# Patient Record
Sex: Male | Born: 1950 | Race: White | Marital: Married | Smoking: Current every day smoker
Health system: Southern US, Community
[De-identification: ages and names within clinical notes are randomized; demographics above are authoritative.]

## PROBLEM LIST (undated history)

## (undated) DIAGNOSIS — N400 Enlarged prostate without lower urinary tract symptoms: Secondary | ICD-10-CM

---

## 2011-03-15 ENCOUNTER — Other Ambulatory Visit: Payer: Self-pay | Admitting: Specialist

## 2011-03-15 ENCOUNTER — Ambulatory Visit
Admission: RE | Admit: 2011-03-15 | Discharge: 2011-03-15 | Disposition: A | Discharge status: Home / Self Care | Payer: Self-pay | Source: Ambulatory Visit | Attending: Specialist | Admitting: Specialist

## 2011-03-15 DIAGNOSIS — R7611 Nonspecific reaction to tuberculin skin test without active tuberculosis: Secondary | ICD-10-CM

## 2016-01-13 ENCOUNTER — Encounter (HOSPITAL_BASED_OUTPATIENT_CLINIC_OR_DEPARTMENT_OTHER): Payer: Self-pay | Admitting: Emergency Medicine

## 2016-01-13 ENCOUNTER — Emergency Department (HOSPITAL_BASED_OUTPATIENT_CLINIC_OR_DEPARTMENT_OTHER)
Admission: EM | Admit: 2016-01-13 | Discharge: 2016-01-13 | Disposition: A | Discharge status: Home / Self Care | Payer: Medicare Other | Attending: Emergency Medicine | Admitting: Emergency Medicine

## 2016-01-13 ENCOUNTER — Emergency Department (HOSPITAL_BASED_OUTPATIENT_CLINIC_OR_DEPARTMENT_OTHER): Payer: Medicare Other

## 2016-01-13 DIAGNOSIS — Z79899 Other long term (current) drug therapy: Secondary | ICD-10-CM | POA: Insufficient documentation

## 2016-01-13 DIAGNOSIS — F17200 Nicotine dependence, unspecified, uncomplicated: Secondary | ICD-10-CM | POA: Diagnosis not present

## 2016-01-13 DIAGNOSIS — R1032 Left lower quadrant pain: Secondary | ICD-10-CM | POA: Diagnosis present

## 2016-01-13 DIAGNOSIS — K5752 Diverticulitis of both small and large intestine without perforation or abscess without bleeding: Secondary | ICD-10-CM | POA: Diagnosis not present

## 2016-01-13 HISTORY — DX: Benign prostatic hyperplasia without lower urinary tract symptoms: N40.0

## 2016-01-13 LAB — CBC WITH DIFFERENTIAL/PLATELET
Basophils Absolute: 0 10*3/uL (ref 0.0–0.1)
Basophils Relative: 1 %
EOS PCT: 2 %
Eosinophils Absolute: 0.1 10*3/uL (ref 0.0–0.7)
HEMATOCRIT: 42.6 % (ref 39.0–52.0)
Hemoglobin: 13.9 g/dL (ref 13.0–17.0)
LYMPHS PCT: 27 %
Lymphs Abs: 1.7 10*3/uL (ref 0.7–4.0)
MCH: 28.6 pg (ref 26.0–34.0)
MCHC: 32.6 g/dL (ref 30.0–36.0)
MCV: 87.7 fL (ref 78.0–100.0)
Monocytes Absolute: 0.5 10*3/uL (ref 0.1–1.0)
Monocytes Relative: 7 %
NEUTROS ABS: 4 10*3/uL (ref 1.7–7.7)
Neutrophils Relative %: 63 %
PLATELETS: 142 10*3/uL — AB (ref 150–400)
RBC: 4.86 MIL/uL (ref 4.22–5.81)
RDW: 13.7 % (ref 11.5–15.5)
WBC: 6.3 10*3/uL (ref 4.0–10.5)

## 2016-01-13 LAB — COMPREHENSIVE METABOLIC PANEL
ALBUMIN: 3.7 g/dL (ref 3.5–5.0)
ALT: 33 U/L (ref 17–63)
ANION GAP: 6 (ref 5–15)
AST: 29 U/L (ref 15–41)
Alkaline Phosphatase: 80 U/L (ref 38–126)
BUN: 12 mg/dL (ref 6–20)
CHLORIDE: 105 mmol/L (ref 101–111)
CO2: 26 mmol/L (ref 22–32)
Calcium: 9.2 mg/dL (ref 8.9–10.3)
Creatinine, Ser: 0.81 mg/dL (ref 0.61–1.24)
GFR calc Af Amer: >60 mL/min (ref >60)
GFR calc non Af Amer: >60 mL/min (ref >60)
GLUCOSE: 106 mg/dL — AB (ref 65–99)
POTASSIUM: 4.3 mmol/L (ref 3.5–5.1)
Sodium: 137 mmol/L (ref 135–145)
Total Bilirubin: 0.8 mg/dL (ref 0.3–1.2)
Total Protein: 6.4 g/dL — ABNORMAL LOW (ref 6.5–8.1)

## 2016-01-13 LAB — URINALYSIS, ROUTINE W REFLEX MICROSCOPIC
Bilirubin Urine: NEGATIVE
GLUCOSE, UA: NEGATIVE mg/dL
HGB URINE DIPSTICK: NEGATIVE
Ketones, ur: NEGATIVE mg/dL
LEUKOCYTES UA: NEGATIVE
Nitrite: NEGATIVE
PH: 7 (ref 5.0–8.0)
PROTEIN: NEGATIVE mg/dL
SPECIFIC GRAVITY, URINE: 1.045 — AB (ref 1.005–1.030)

## 2016-01-13 LAB — LIPASE, BLOOD: Lipase: 24 U/L (ref 11–51)

## 2016-01-13 MED ORDER — MORPHINE SULFATE (PF) 4 MG/ML IV SOLN
4.0000 mg | Freq: Once | INTRAVENOUS | Status: DC
Start: 2016-01-13 — End: 2016-01-13
  Filled 2016-01-13: qty 1

## 2016-01-13 MED ORDER — IOPAMIDOL (ISOVUE-300) INJECTION 61%
100.0000 mL | Freq: Once | INTRAVENOUS | Status: AC | PRN
  Administered 2016-01-13: 100 mL via INTRAVENOUS

## 2016-01-13 MED ORDER — SODIUM CHLORIDE 0.9 % IV BOLUS (SEPSIS)
1000.0000 mL | Freq: Once | INTRAVENOUS | Status: AC
  Administered 2016-01-13: 1000 mL via INTRAVENOUS

## 2016-01-13 MED ORDER — ONDANSETRON HCL 4 MG/2ML IJ SOLN
4.0000 mg | Freq: Once | INTRAMUSCULAR | Status: DC
  Filled 2016-01-13: qty 2

## 2016-01-13 MED ORDER — AMOXICILLIN-POT CLAVULANATE 875-125 MG PO TABS: 1.0000 | ORAL_TABLET | Freq: Two times a day (BID) | ORAL | 0 refills | Status: AC

## 2016-01-13 NOTE — ED Notes (Signed)
Pt reports he doesn't need any meds for nausea and/or pain at this time. States he will let me know if they're needed.

## 2016-01-13 NOTE — ED Notes (Signed)
Patient transported to CT 

## 2016-01-13 NOTE — ED Provider Notes (Signed)
[facility]   CSN: [id number] Arrival date & time: 01/13/16  1150     History   Chief Complaint Chief Complaint  Patient presents with  . Abdominal Pain    HPI Nathaniel Warner is a 65 y.o. male.  65 yo M with a chief complaint of lower abdominal pain. This been going on for about a week. Patient was initially seen and diagnosed with diverticulitis. He is about to finish his antibiotic therapy but now is having some suprapubic as well as left lower quadrant tenderness. Denies nausea or vomiting has had some loose stools in the morning but denies overt diarrhea. Denies injury. Pain is worse with palpation and movement.   The history is provided by the patient.  Abdominal Pain   This is a new problem. The current episode started more than 1 week ago. The problem occurs constantly. The problem has not changed since onset.The pain is located in the suprapubic region and LLQ. The quality of the pain is sharp and shooting. The pain is at a severity of 5/10. The patient is experiencing no pain. Pertinent negatives include fever, diarrhea, nausea, vomiting, constipation, headaches, arthralgias and myalgias. Nothing aggravates the symptoms. Nothing relieves the symptoms.    Past Medical History:  Diagnosis Date  . Prostate enlargement     There are no active problems to display for this patient.   History reviewed. No pertinent surgical history.     Home Medications    Prior to Admission medications   Medication Sig Start Date End Date Taking? Authorizing Provider  tamsulosin (FLOMAX) 0.4 MG CAPS capsule Take 0.4 mg by mouth.   Yes Historical Provider, MD  amoxicillin-clavulanate (AUGMENTIN) 875-125 MG tablet Take 1 tablet by mouth every 12 (twelve) hours. 01/13/16   Melene Planan Deaven Urwin, DO    Family History No family history on file.  Social History Social History  Substance Use Topics  . Smoking status: Current Every Day Smoker  . Smokeless tobacco: Never  Used  . Alcohol use No     Allergies   Review of patient's allergies indicates no known allergies.   Review of Systems Review of Systems  Constitutional: Negative for chills and fever.  HENT: Negative for congestion and facial swelling.   Eyes: Negative for discharge and visual disturbance.  Respiratory: Negative for shortness of breath.   Cardiovascular: Negative for chest pain and palpitations.  Gastrointestinal: Positive for abdominal pain. Negative for constipation, diarrhea, nausea and vomiting.  Musculoskeletal: Negative for arthralgias and myalgias.  Skin: Negative for color change and rash.  Neurological: Negative for tremors, syncope and headaches.  Psychiatric/Behavioral: Negative for confusion and dysphoric mood.     Physical Exam Updated Vital Signs BP 122/64   Pulse 60   Temp 97.8 F (36.6 C) (Oral)   Resp 18   Ht 5\' 10"  (1.778 m)   Wt 201 lb (91.2 kg)   SpO2 97%   BMI 28.84 kg/m   Physical Exam  Constitutional: He is oriented to person, place, and time. He appears well-developed and well-nourished.  HENT:  Head: Normocephalic and atraumatic.  Eyes: EOM are normal. Pupils are equal, round, and reactive to light.  Neck: Normal range of motion. Neck supple. No JVD present.  Cardiovascular: Normal rate and regular rhythm.  Exam reveals no gallop and no friction rub.   No murmur heard. Pulmonary/Chest: No respiratory distress. He has no wheezes.  Abdominal: He exhibits no distension and no mass. There is tenderness (mild worst to the  suprapubic and LLQ region). There is no rebound and no guarding.  Musculoskeletal: Normal range of motion.  Neurological: He is alert and oriented to person, place, and time.  Skin: No rash noted. No pallor.  Psychiatric: He has a normal mood and affect. His behavior is normal.  Nursing note and vitals reviewed.    ED Treatments / Results  Labs (all labs ordered are listed, but only abnormal results are displayed) Labs  Reviewed  COMPREHENSIVE METABOLIC PANEL - Abnormal; Notable for the following:       Result Value   Glucose, Bld 106 (*)    Total Protein 6.4 (*)    All other components within normal limits  CBC WITH DIFFERENTIAL/PLATELET - Abnormal; Notable for the following:    Platelets 142 (*)    All other components within normal limits  URINALYSIS, ROUTINE W REFLEX MICROSCOPIC (NOT AT [facility]) - Abnormal; Notable for the following:    Specific Gravity, Urine 1.045 (*)    All other components within normal limits  LIPASE, BLOOD    EKG  EKG Interpretation None       Radiology Ct Abdomen Pelvis W Contrast  Result Date: 01/13/2016 CLINICAL DATA:  Lower pelvic pain bilaterally. Finishing up antibiotics for recently diagnosed diverticulitis. EXAM: CT ABDOMEN AND PELVIS WITH CONTRAST TECHNIQUE: Multidetector CT imaging of the abdomen and pelvis was performed using the standard protocol following bolus administration of intravenous contrast. CONTRAST:  100mL ISOVUE-300 IOPAMIDOL (ISOVUE-300) INJECTION 61% COMPARISON:  None. FINDINGS: Lower chest: Minimal linear atelectasis or scarring at both lung bases. Hepatobiliary: No focal liver abnormality is seen. No gallstones, gallbladder wall thickening, or biliary dilatation. Pancreas: Unremarkable. No pancreatic ductal dilatation or surrounding inflammatory changes. Spleen: Normal in size without focal abnormality. Adrenals/Urinary Tract: Adrenal glands are unremarkable. Kidneys are normal, without renal calculi, focal lesion, or hydronephrosis. Bladder is unremarkable. Stomach/Bowel: Small number of proximal sigmoid and distal descending colon diverticula. Minimal soft tissue stranding adjacent to the proximal sigmoid colon. No extraluminal gas or fluid collections are seen. Normal appearing stomach, small bowel and appendix. Vascular/Lymphatic: Mild atheromatous arterial calcifications, including the abdominal aorta. No enlarged lymph nodes. Reproductive:  Minimally enlarged prostate gland. Other: None. Musculoskeletal: Mild lumbar and lower thoracic spine degenerative changes. Minimal bilateral hip degenerative changes. Right lateral chest intramuscular fat density mass, measuring 5.2 x 3.9 cm on image number 6 of series 2. IMPRESSION: 1. Minimal proximal sigmoid colon diverticulitis without abscess. 2. Mild proximal sigmoid and distal descending colon diverticulosis. 3. Mild aortic atherosclerosis. 4. 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 Electronically Signed   By: Beckie SaltsSteven  Reid M.D.   On: 01/13/2016 14:28    Procedures Procedures (including critical care time)  Medications Ordered in ED Medications  morphine 4 MG/ML injection 4 mg (4 mg Intravenous Not Given 01/13/16 1509)  ondansetron (ZOFRAN) injection 4 mg (4 mg Intravenous Not Given 01/13/16 1509)  sodium chloride 0.9 % bolus 1,000 mL (0 mLs Intravenous Stopped 01/13/16 1508)  iopamidol (ISOVUE-300) 61 % injection 100 mL (100 mLs Intravenous Contrast Given 01/13/16 1357)     Initial Impression / Assessment and Plan / ED Course  I have reviewed the triage vital signs and the nursing notes.  Pertinent labs & imaging results that were available during my care of the patient were reviewed by me and considered in my medical decision making (see chart for details).  Clinical Course    65 yo M With a chief complaint of spreading abdominal pain after being treated for  diverticulitis. I will obtain a CT scan to evaluate for possible complication.  CT scan with persistent diverticulitis.  Change abx to augmentin.  Given GI follow up.   3:19 PM:  I have discussed the diagnosis/risks/treatment options with the patient and believe the pt to be eligible for discharge home to follow-up with GI. We also discussed returning to the ED immediately if new or worsening sx occur. We discussed the sx which are most concerning (e.g., sudden worsening pain, fever, inability to tolerate by  mouth) that necessitate immediate return. Medications administered to the patient during their visit and any new prescriptions provided to the patient are listed below.  Medications given during this visit Medications  morphine 4 MG/ML injection 4 mg (4 mg Intravenous Not Given 01/13/16 1509)  ondansetron (ZOFRAN) injection 4 mg (4 mg Intravenous Not Given 01/13/16 1509)  sodium chloride 0.9 % bolus 1,000 mL (0 mLs Intravenous Stopped 01/13/16 1508)  iopamidol (ISOVUE-300) 61 % injection 100 mL (100 mLs Intravenous Contrast Given 01/13/16 1357)     The patient appears reasonably screen and/or stabilized for discharge and I doubt any other medical condition or other [facility] requiring further screening, evaluation, or treatment in the ED at this time prior to discharge.    Final Clinical Impressions(s) / ED Diagnoses   Final diagnoses:  Diverticulitis of both small and large intestine without perforation or abscess without bleeding    New Prescriptions Discharge Medication List as of 01/13/2016  3:01 PM    START taking these medications   Details  amoxicillin-clavulanate (AUGMENTIN) 875-125 MG tablet Take 1 tablet by mouth every 12 (twelve) hours., Starting Sat 01/13/2016, Print         Melene Plan, DO 01/13/16 1519

## 2016-01-13 NOTE — ED Notes (Signed)
Patient is alert and oriented x3.  He was given DC instructions and follow up visit instructions.  Patient gave verbal understanding.  He was DC ambulatory under his own power to home.  V/S stable.  He was not showing any signs of distress on DC 

## 2016-01-13 NOTE — ED Triage Notes (Signed)
Pt states LLQ pain started 2 weeks ago and was put on abx for diverticulitis.  Tomorrow is last day for abx treatment and pt states pain is now bilateral stretching across lower abdomen.

## 2017-04-02 IMAGING — CT CT ABD-PELV W/ CM
2 of 5 series · 16 of 46 positions shown, 18 images · IV contrast (APPLIED)
Comparison: None.

CLINICAL DATA: Lower pelvic pain bilaterally. Finishing up
antibiotics for recently diagnosed diverticulitis.

EXAM:
CT ABDOMEN AND PELVIS WITH CONTRAST
TECHNIQUE: Multidetector CT imaging of the abdomen and pelvis was performed
using the standard protocol following bolus administration of
intravenous contrast.
CONTRAST:  100mL UMDEE1-EBB IOPAMIDOL (UMDEE1-EBB) INJECTION 61%

[Series 2: axial st · axial · 0.81mm/px · z∈[-492,-42]mm · 13 of 102 slices shown, 15 images]
[im 6/102  soft-tissue]
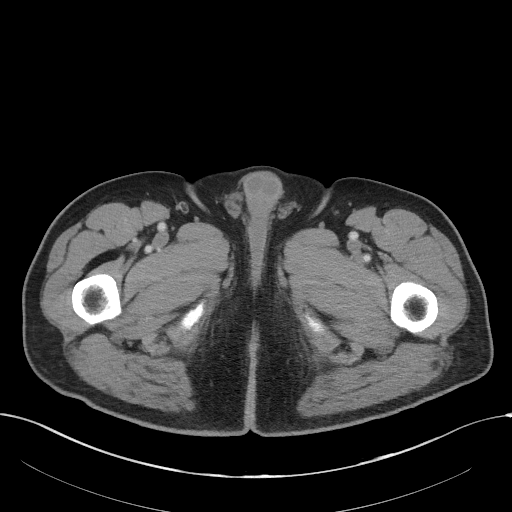
[im 6/102  bone]
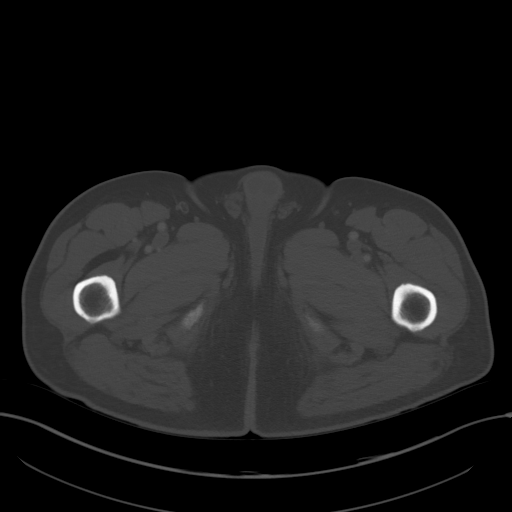
[im 12/102  soft-tissue]
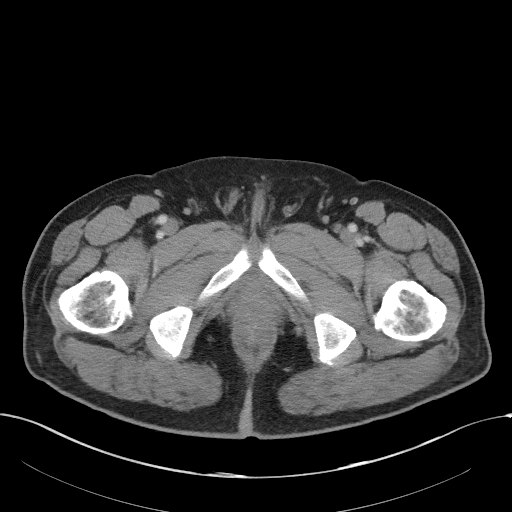
[im 23/102  soft-tissue]
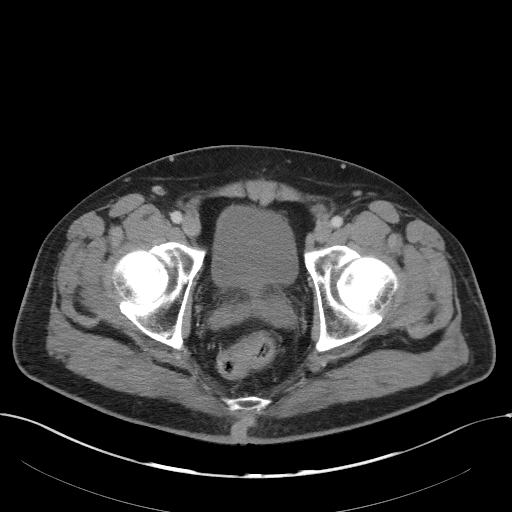
[im 29/102  soft-tissue]
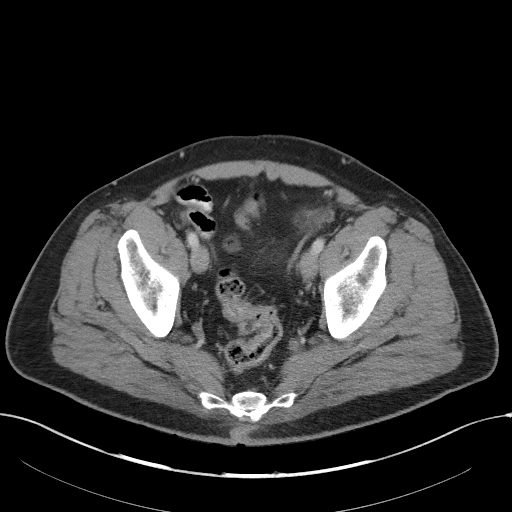
[im 34/102  soft-tissue]
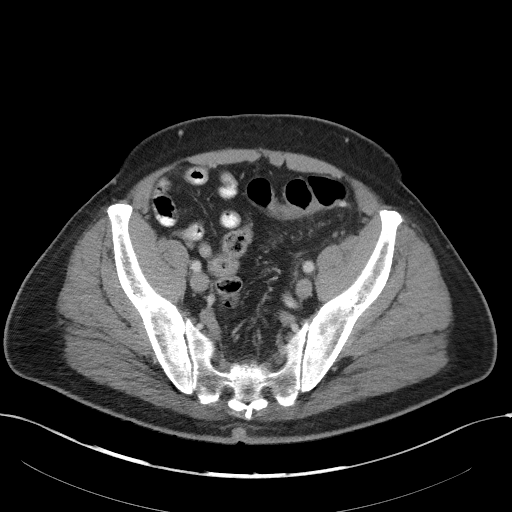
[im 45/102  soft-tissue]
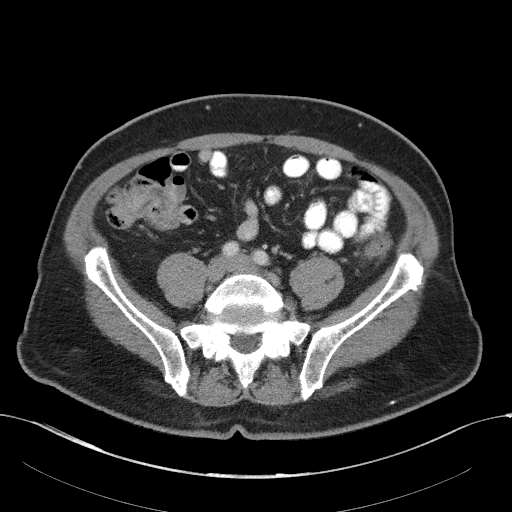
[im 51/102  soft-tissue]
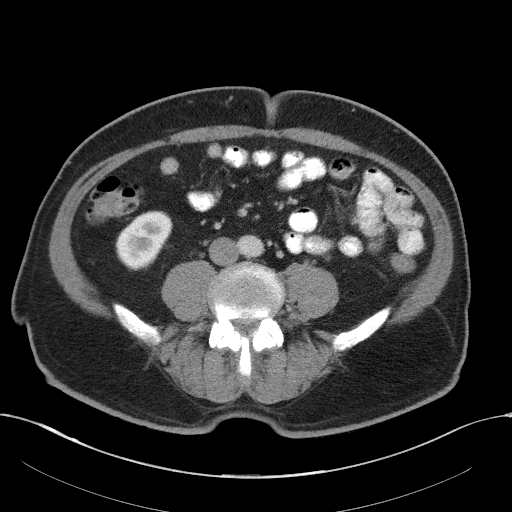
[im 57/102  soft-tissue]
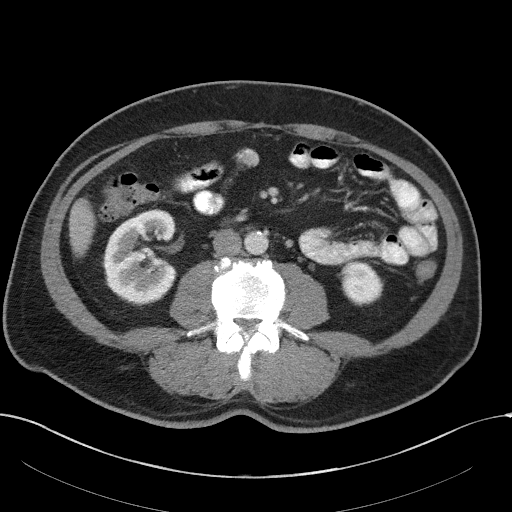
[im 68/102  soft-tissue]
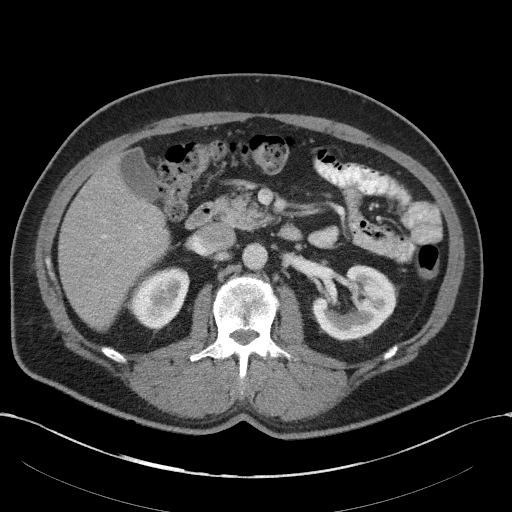
[im 68/102  bone]
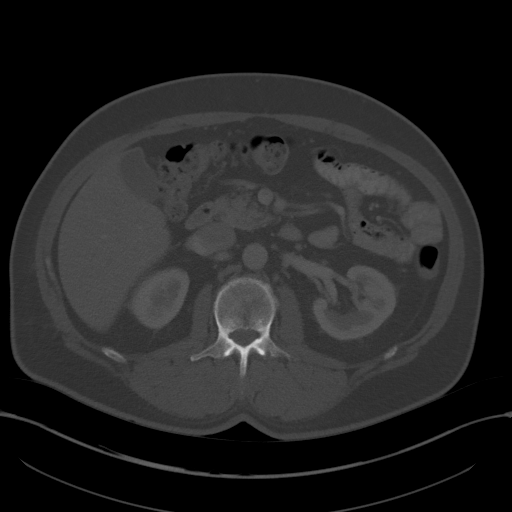
[im 73/102  soft-tissue]
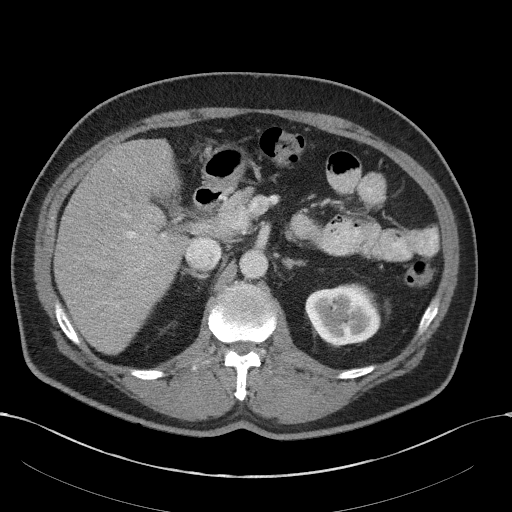
[im 79/102  soft-tissue]
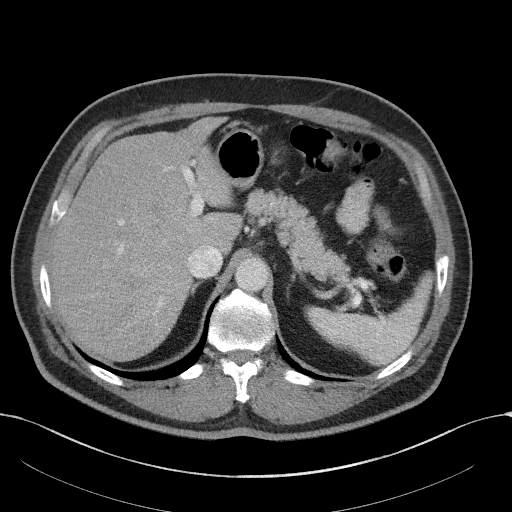
[im 90/102  soft-tissue]
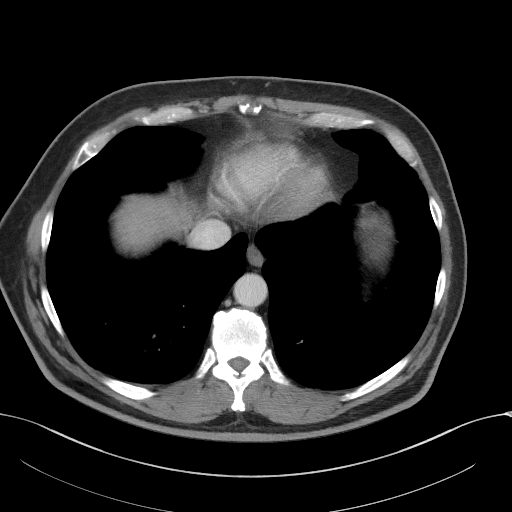
[im 96/102  soft-tissue]
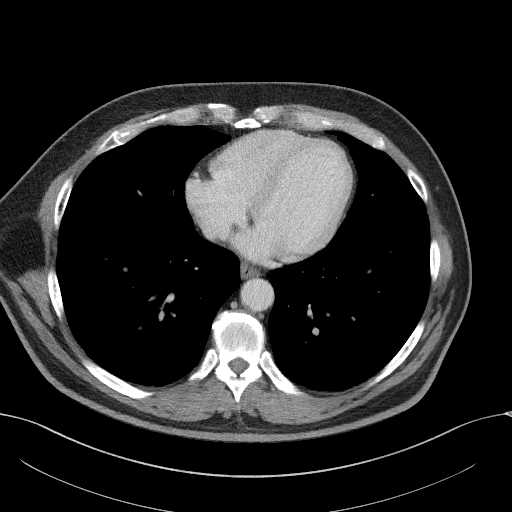

[Series 5: coronal st · coronal · 0.85mm/px · 3 of 92 slices shown]
[im 31/92  soft-tissue]
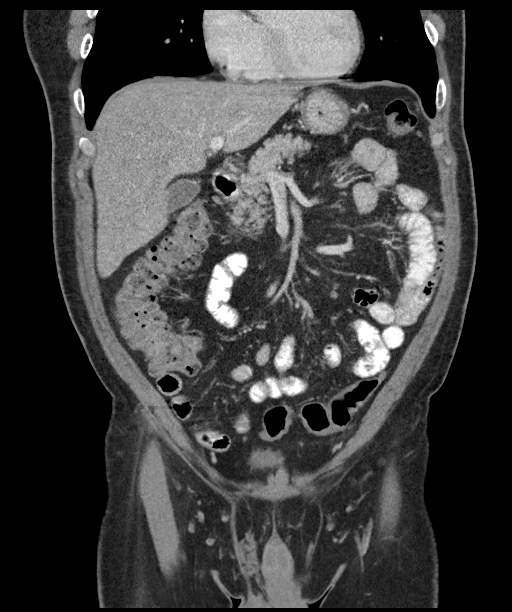
[im 41/92  soft-tissue]
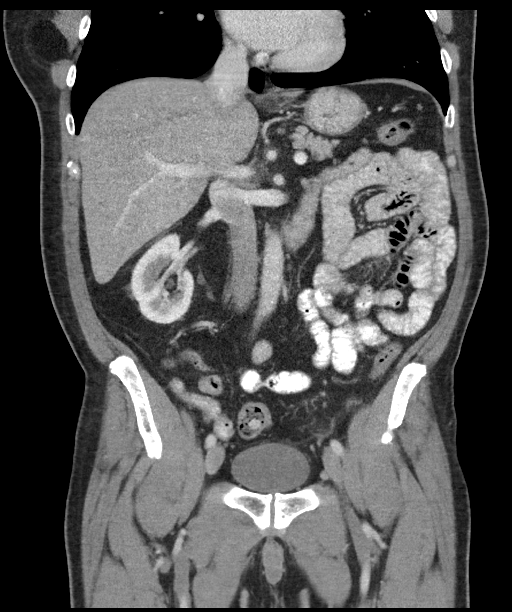
[im 51/92  soft-tissue]
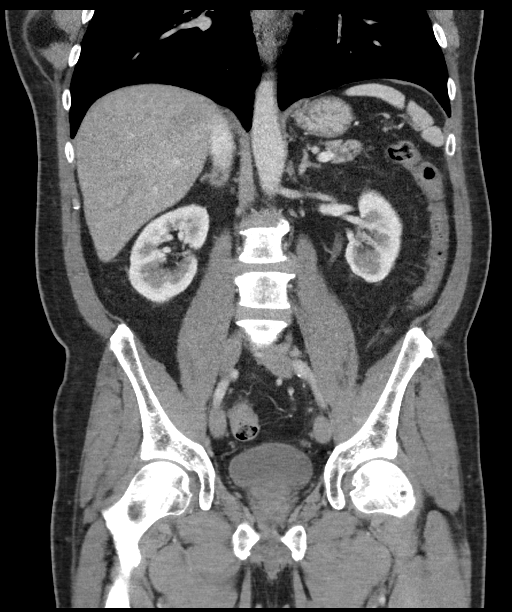

[16 of 46 positions shown; findings below may reference images not displayed]

FINDINGS: Lower chest: Minimal linear atelectasis or scarring at both lung
bases.

Hepatobiliary: No focal liver abnormality is seen. No gallstones,
gallbladder wall thickening, or biliary dilatation.

Pancreas: Unremarkable. No pancreatic ductal dilatation or
surrounding inflammatory changes.

Spleen: Normal in size without focal abnormality.

Adrenals/Urinary Tract: Adrenal glands are unremarkable. Kidneys are
normal, without renal calculi, focal lesion, or hydronephrosis.
Bladder is unremarkable.

Stomach/Bowel: Small number of proximal sigmoid and distal
descending colon diverticula. Minimal soft tissue stranding adjacent
to the proximal sigmoid colon. No extraluminal gas or fluid
collections are seen. Normal appearing stomach, small bowel and
appendix.

Vascular/Lymphatic: Mild atheromatous arterial calcifications,
including the abdominal aorta. No enlarged lymph nodes.

Reproductive: Minimally enlarged prostate gland.

Other: None.

Musculoskeletal: Mild lumbar and lower thoracic spine degenerative
changes. Minimal bilateral hip degenerative changes. Right lateral
chest intramuscular fat density mass, measuring 5.2 x 3.9 cm on
image number 6 of series 2.
IMPRESSION: 1. Minimal proximal sigmoid colon diverticulitis without abscess.
2. Mild proximal sigmoid and distal descending colon diverticulosis.
3. Mild aortic atherosclerosis.
4. 5.2 cm right lateral, lower chest intramuscular lipoma.

## 2018-09-08 ENCOUNTER — Other Ambulatory Visit (HOSPITAL_BASED_OUTPATIENT_CLINIC_OR_DEPARTMENT_OTHER): Payer: Self-pay | Admitting: Emergency Medicine

## 2018-09-08 DIAGNOSIS — M79662 Pain in left lower leg: Secondary | ICD-10-CM

## 2018-09-12 ENCOUNTER — Other Ambulatory Visit: Payer: Self-pay

## 2018-09-12 ENCOUNTER — Ambulatory Visit (HOSPITAL_BASED_OUTPATIENT_CLINIC_OR_DEPARTMENT_OTHER)
Admission: RE | Admit: 2018-09-12 | Discharge: 2018-09-12 | Disposition: A | Discharge status: Home / Self Care | Payer: Medicare HMO | Source: Ambulatory Visit | Attending: Emergency Medicine | Admitting: Emergency Medicine

## 2018-09-12 DIAGNOSIS — M79662 Pain in left lower leg: Secondary | ICD-10-CM | POA: Insufficient documentation

## 2019-12-01 IMAGING — US VENOUS DOPPLER ULTRASOUND OF LEFT LOWER EXTREMITY
1 series · 13 of 24 positions shown · non-contrast
Comparison: None.

CLINICAL DATA: 68-year-old male with left calf pain for 1 week.
Patient is a long distance truck driver and smoker.



[Series 1: venous doppler ultrasound of left lower extremity · 13 of 31 slices shown]
[im 1/31]
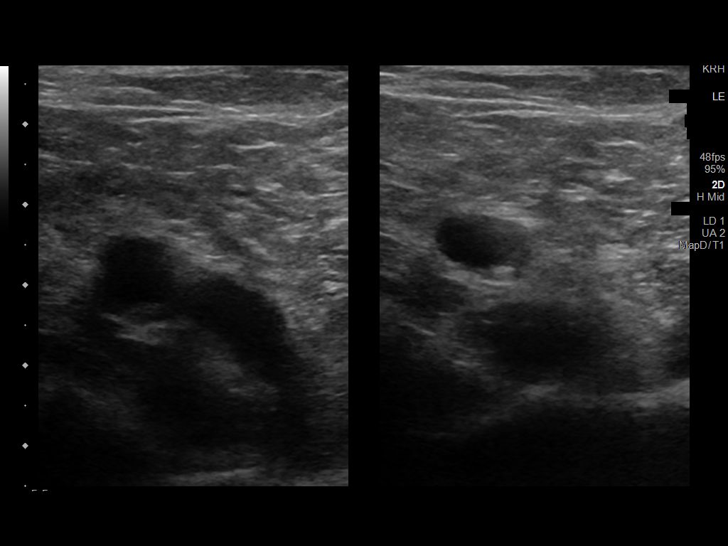
[im 3/31]
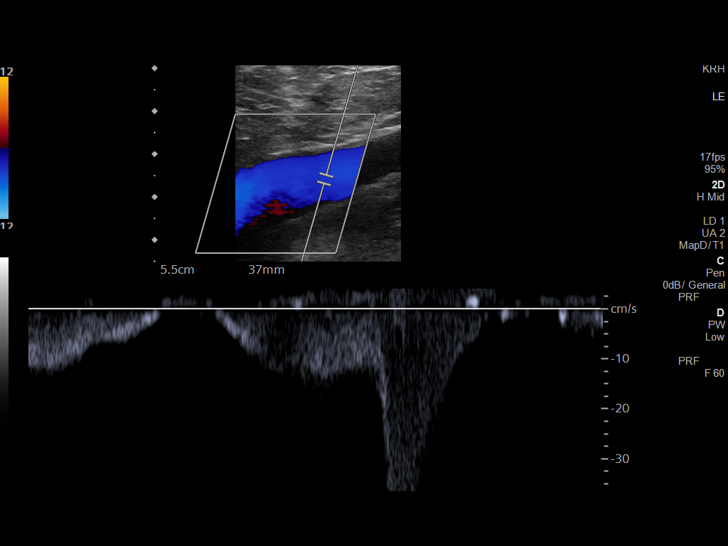
[im 6/31]
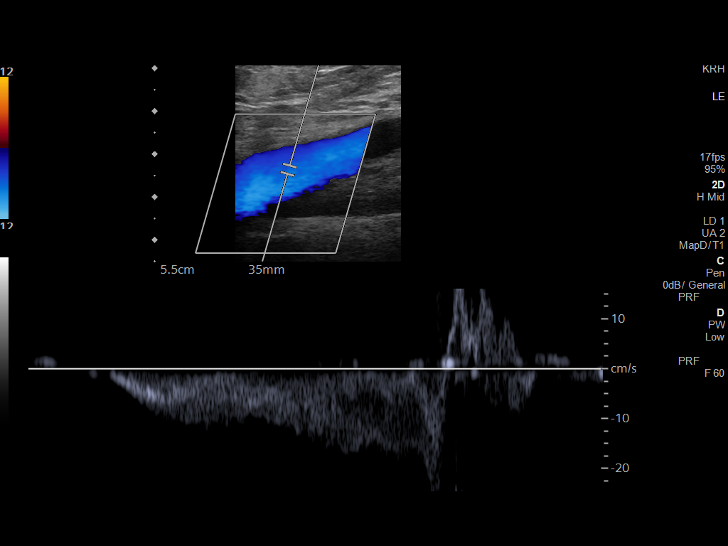
[im 8/31]
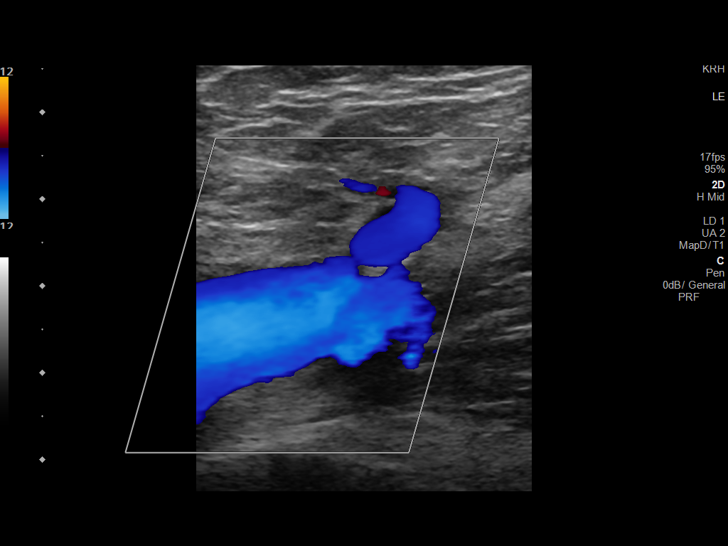
[im 11/31]
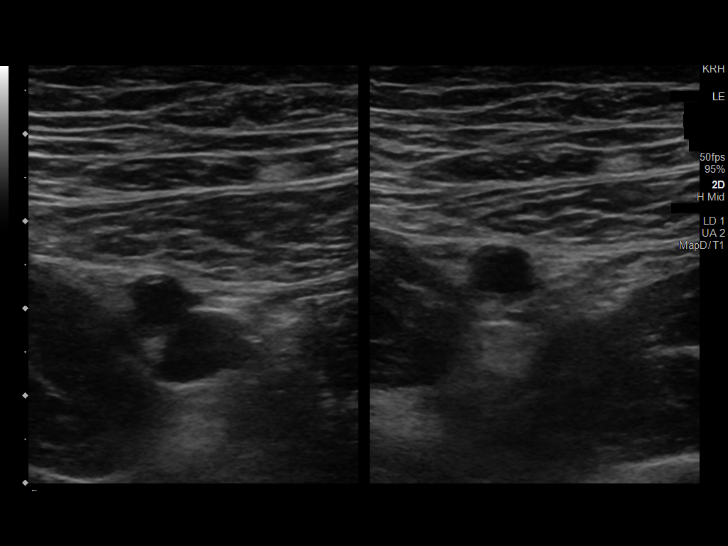
[im 14/31]
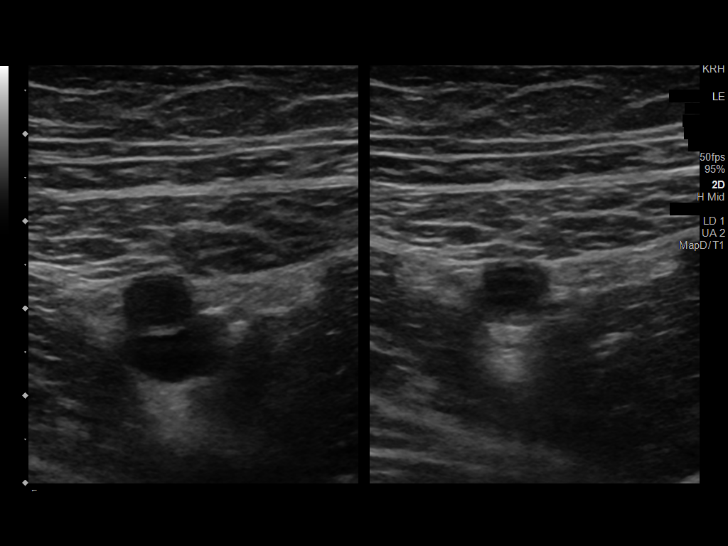
[im 16/31]
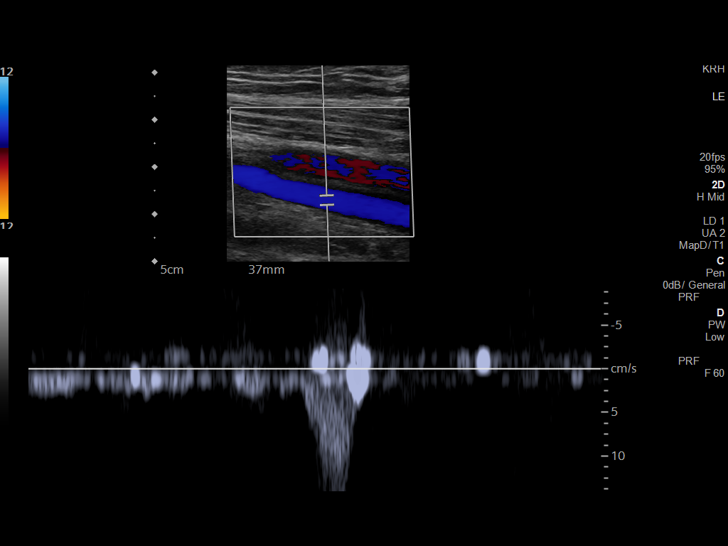
[im 17/31]
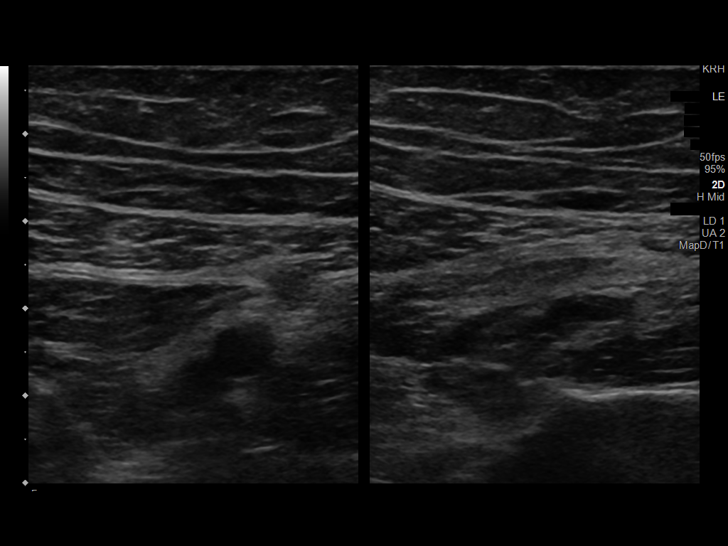
[im 20/31]
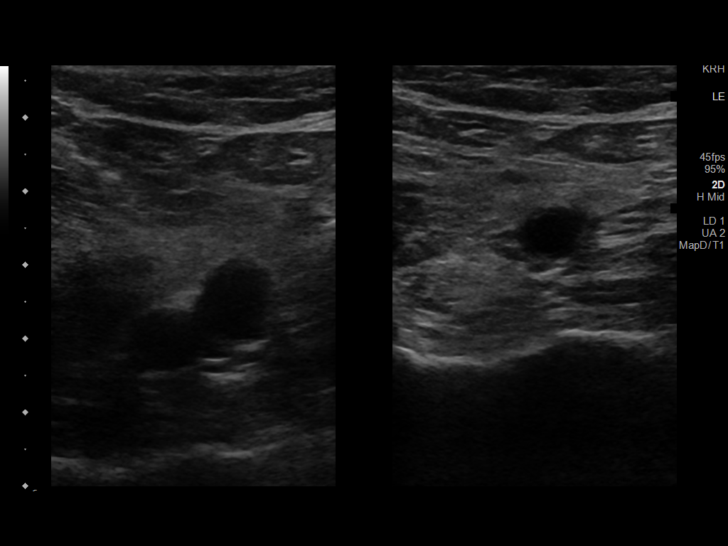
[im 23/31]
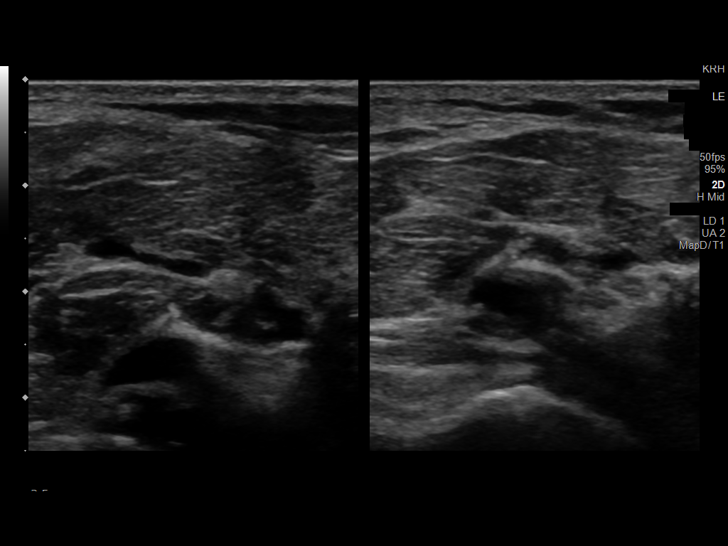
[im 25/31]
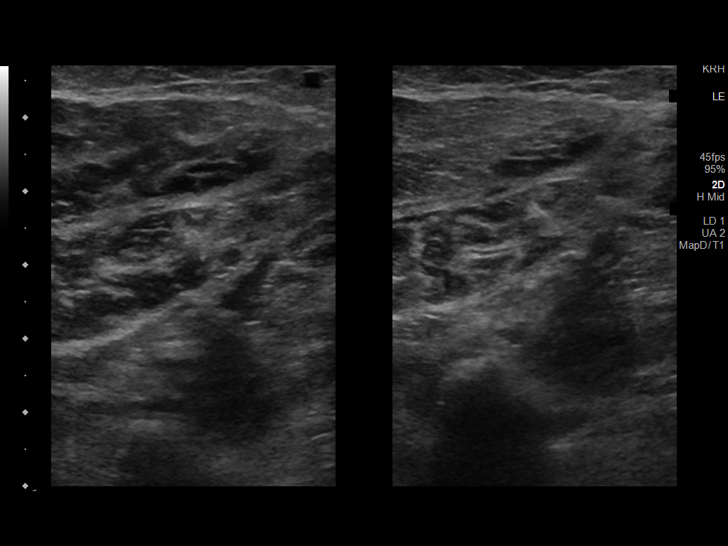
[im 28/31]
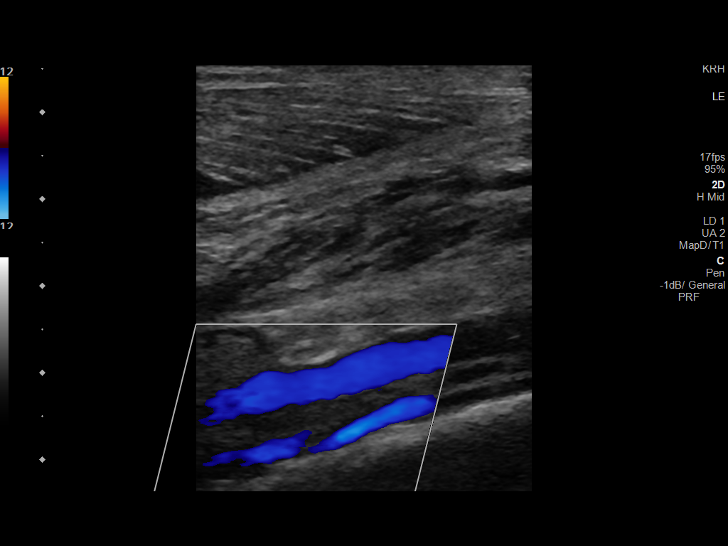
[im 31/31]
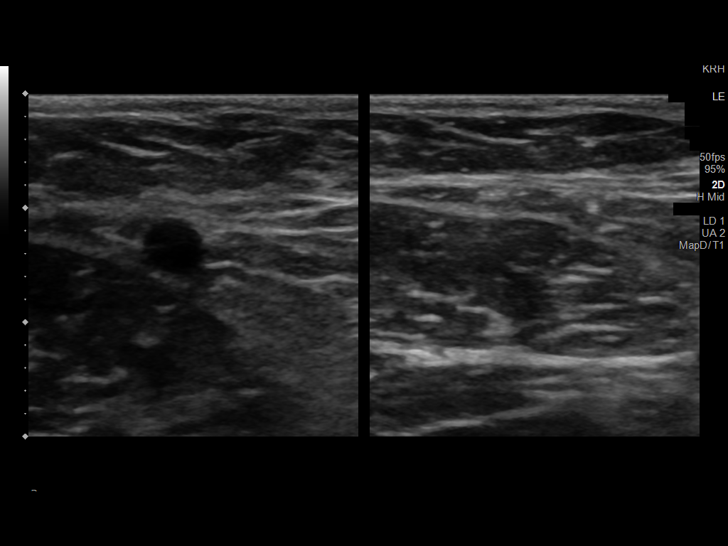

[13 of 24 positions shown; findings below may reference images not displayed]

FINDINGS: Contralateral Common Femoral Vein: Respiratory phasicity is normal
and symmetric with the symptomatic side. No evidence of thrombus.
Normal compressibility.

Common Femoral Vein: No evidence of thrombus. Normal
compressibility, respiratory phasicity and response to augmentation.

Saphenofemoral Junction: No evidence of thrombus. Normal
compressibility and flow on color Doppler imaging.

Profunda Femoral Vein: No evidence of thrombus. Normal
compressibility and flow on color Doppler imaging.

Femoral Vein: No evidence of thrombus. Normal compressibility,
respiratory phasicity and response to augmentation.

Popliteal Vein: No evidence of thrombus. Normal compressibility,
respiratory phasicity and response to augmentation.

Calf Veins: No evidence of thrombus. Normal compressibility and flow
on color Doppler imaging.

Superficial Great Saphenous Vein: No evidence of thrombus. Normal
compressibility.

Venous Reflux:  None.

Other Findings:  None.
IMPRESSION: No evidence of deep venous thrombosis.
